# Patient Record
Sex: Male | Born: 1990 | Race: White | Hispanic: No | Marital: Single | Smoking: Never smoker
Health system: Southern US, Community
[De-identification: ages and names within clinical notes are randomized; demographics above are authoritative.]

## PROBLEM LIST (undated history)

## (undated) HISTORY — PX: NO PAST SURGERIES: SHX2092

---
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
## 2011-03-19 ENCOUNTER — Emergency Department: Payer: Self-pay | Admitting: Emergency Medicine

## 2011-08-21 ENCOUNTER — Emergency Department: Payer: Self-pay | Admitting: Emergency Medicine

## 2013-07-09 ENCOUNTER — Emergency Department: Payer: Self-pay | Admitting: Emergency Medicine

## 2014-10-16 IMAGING — CR RIGHT ANKLE - COMPLETE 3+ VIEW
1 series · 3 of 3 positions shown · non-contrast
Comparison: None.

CLINICAL DATA: Right ankle pain

EXAM:
RIGHT ANKLE - COMPLETE 3+ VIEW

[Series 1: ap · 0.17mm/px · 3 of 3 slices shown]
[im 1/3]
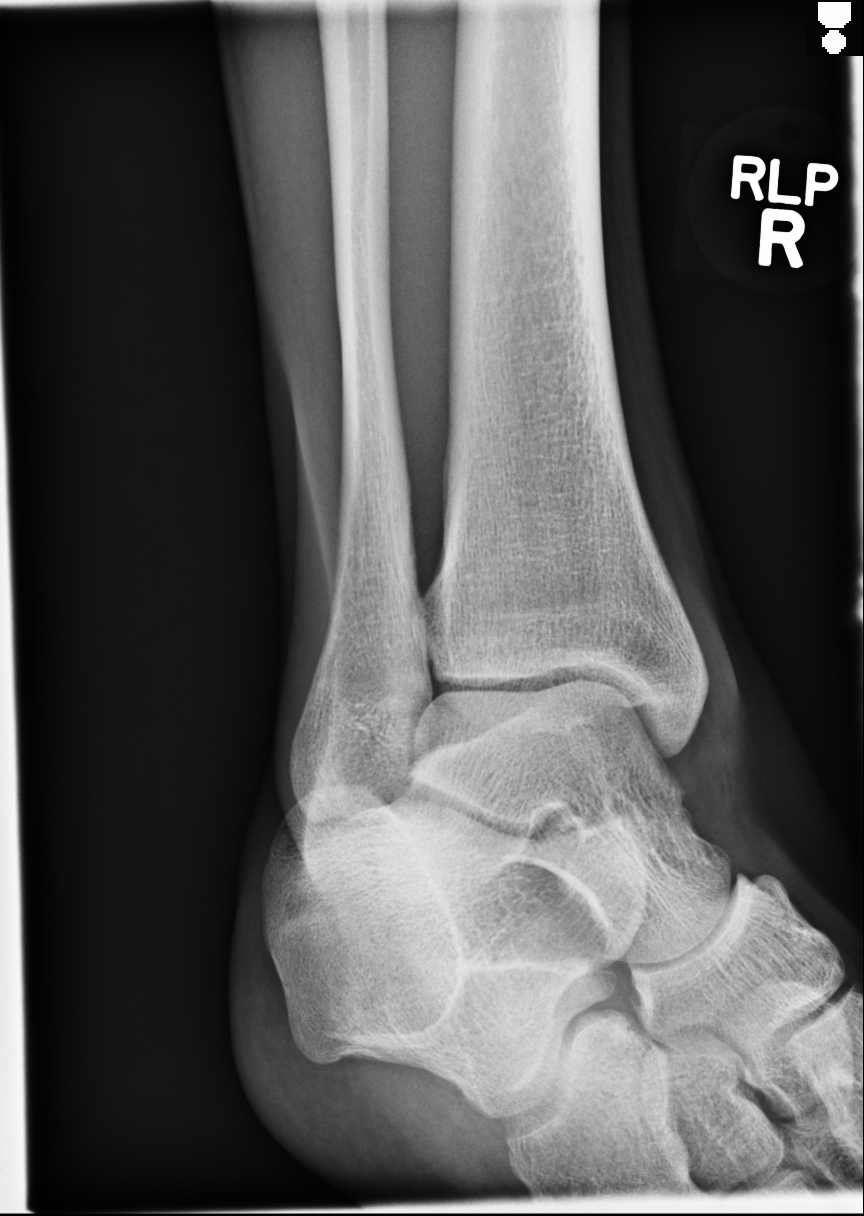
[im 2/3]
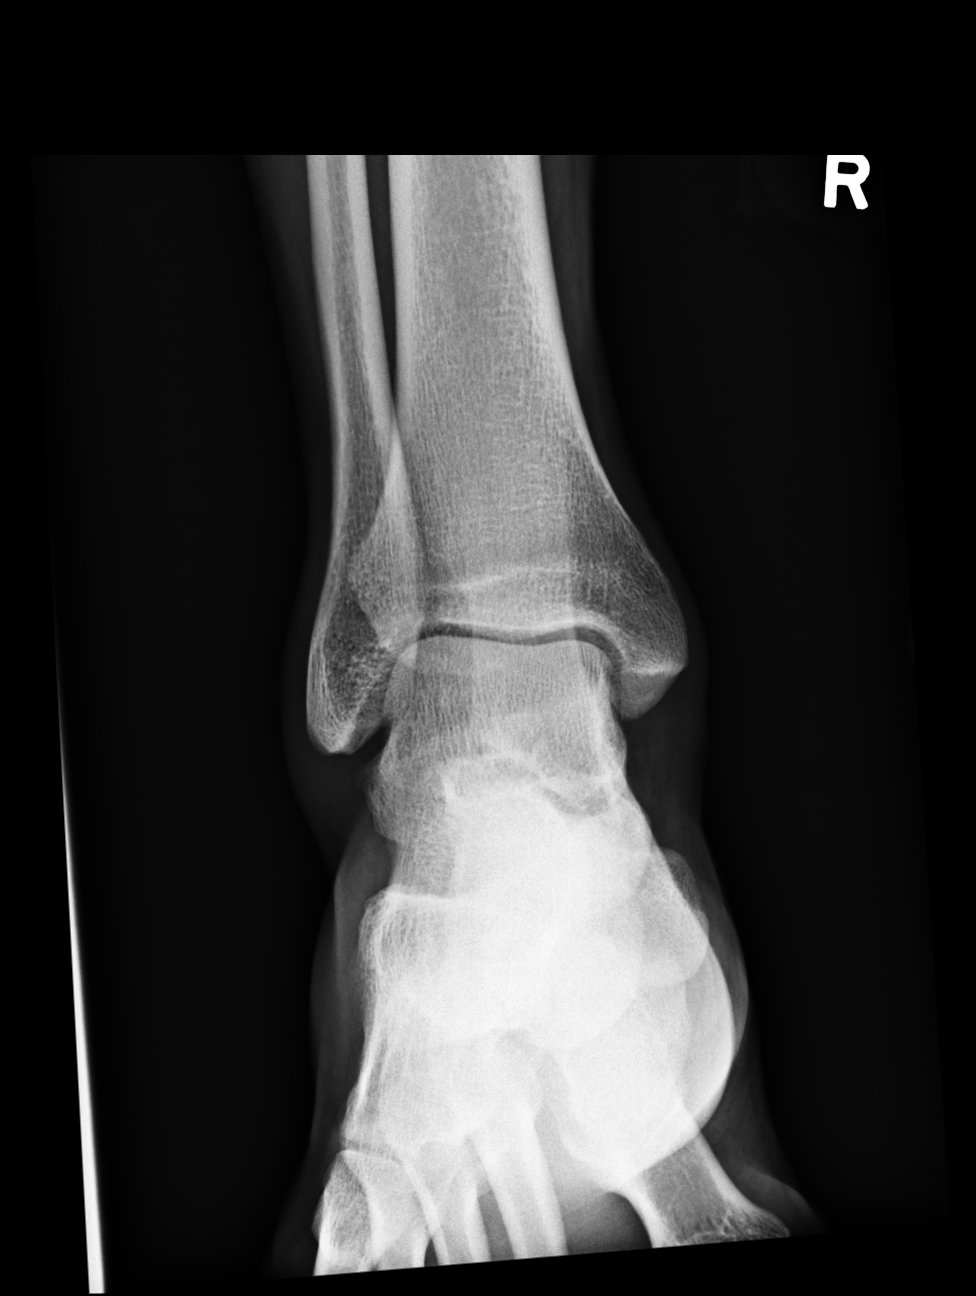
[im 3/3]
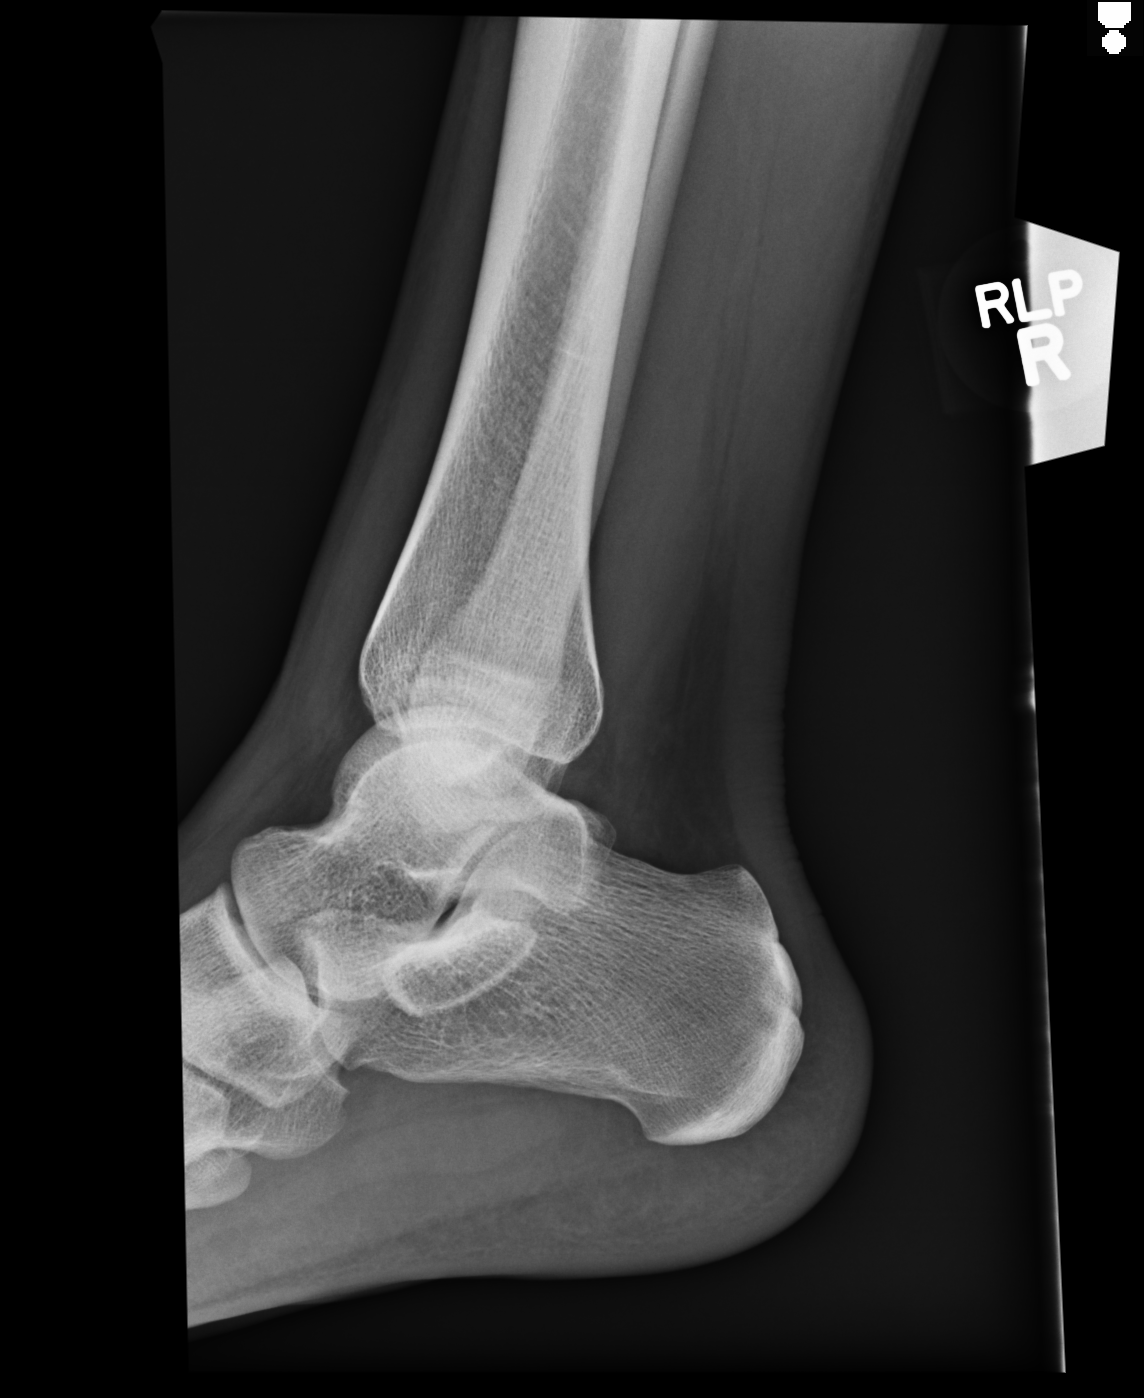

[3 of 3 positions shown; findings below may reference images not displayed]

FINDINGS: There is no evidence of fracture, dislocation, or joint effusion.
There is no evidence of arthropathy or other focal bone abnormality.
Soft tissues are unremarkable.
IMPRESSION: No acute abnormality noted.

## 2015-03-27 ENCOUNTER — Emergency Department
Admission: EM | Admit: 2015-03-27 | Discharge: 2015-03-27 | Disposition: A | Discharge status: Home / Self Care | Payer: Self-pay | Attending: Student | Admitting: Student

## 2015-03-27 ENCOUNTER — Encounter: Payer: Self-pay | Admitting: *Deleted

## 2015-03-27 DIAGNOSIS — K011 Impacted teeth: Secondary | ICD-10-CM | POA: Insufficient documentation

## 2015-03-27 DIAGNOSIS — Z880 Allergy status to penicillin: Secondary | ICD-10-CM | POA: Insufficient documentation

## 2015-03-27 DIAGNOSIS — K0889 Other specified disorders of teeth and supporting structures: Secondary | ICD-10-CM | POA: Insufficient documentation

## 2015-03-27 MED ORDER — SULFAMETHOXAZOLE-TRIMETHOPRIM 800-160 MG PO TABS: 1.0000 | ORAL_TABLET | Freq: Two times a day (BID) | ORAL | Status: DC

## 2015-03-27 NOTE — ED Notes (Signed)
States dental pain left side, states he was supposed to get his wisdom teeth out a while ago and hasnt

## 2015-03-27 NOTE — ED Provider Notes (Signed)
[facility] Emergency Department Provider Note  ____________________________________________  Time seen: Approximately 4:22 PM  I have reviewed the triage vital signs and the nursing notes.   HISTORY  Chief Complaint Dental Pain    HPI Paul Love is a 25 y.o. male presents with pains to his left lower gumline. Patient states that he got was some teeth that are impacted and concerned about infection. Denies any pain just concerned about infection.Pain is nonradiating has taken over-the-counter Tylenol and ibuprofen with good relief. Has not contacted tenderness secondary to come. Rates pain as a 1/10.   History reviewed. No pertinent past medical history.  There are no active problems to display for this patient.   No past surgical history on file.  Current Outpatient Rx  Name  Route  Sig  Dispense  Refill  . sulfamethoxazole-trimethoprim (BACTRIM DS,SEPTRA DS) 800-160 MG tablet   Oral   Take 1 tablet by mouth 2 (two) times daily.   20 tablet   0     Allergies Penicillins  History reviewed. No pertinent family history.  Social History Social History  Substance Use Topics  . Smoking status: None  . Smokeless tobacco: None  . Alcohol Use: None    Review of Systems Constitutional: No fever/chills Eyes: No visual changes. ENT: No sore throat. Positive for dental pain and impacted wisdom teeth Cardiovascular: Denies chest pain. Respiratory: Denies shortness of breath. Gastrointestinal: No abdominal pain.  No nausea, no vomiting.  No diarrhea.  No constipation. Genitourinary: Negative for dysuria. Musculoskeletal: Negative for back pain. Skin: Negative for rash. Neurological: Negative for headaches, focal weakness or numbness.  10-point ROS otherwise negative.  ____________________________________________   PHYSICAL EXAM:  VITAL SIGNS: ED Triage Vitals  Enc Vitals Group     BP 03/27/15 1610 135/85 mmHg     Pulse Rate  03/27/15 1610 68     Resp 03/27/15 1610 18     Temp 03/27/15 1610 97.6 F (36.4 C)     Temp Source 03/27/15 1610 Oral     SpO2 03/27/15 1610 100 %     Weight 03/27/15 1610 160 lb (72.576 kg)     Height 03/27/15 1610  (1.88 m)     Head Cir --      Peak Flow --      Pain Score --      Pain Loc --      Pain Edu? --      Excl. in [place]? --     Constitutional: Alert and oriented. Well appearing and in no acute distress. Mouth/Throat: Mucous membranes are moist.  Left lower wisdom teeth impacted with erythema noted to the gumline. Neck: No stridor.   Cardiovascular: Normal rate, regular rhythm. Grossly normal heart sounds.  Good peripheral circulation. Respiratory: Normal respiratory effort.  No retractions. Lungs CTAB. Neurologic:  Normal speech and language. No gross focal neurologic deficits are appreciated. No gait instability. Skin:  Skin is warm, dry and intact. No rash noted. Psychiatric: Mood and affect are normal. Speech and behavior are normal.  ____________________________________________   LABS (all labs ordered are listed, but only abnormal results are displayed)  Labs Reviewed - No data to display ____________________________________________   PROCEDURES  Procedure(s) performed: None  Critical Care performed: No  ____________________________________________   INITIAL IMPRESSION / ASSESSMENT AND PLAN / ED COURSE  Pertinent labs & imaging results that were available during my care of the patient were reviewed by me and considered in my medical decision making (see  chart for details).  Impacted wisdom teeth with erythema to the gumline. Rx given for Bactrim DS twice a day #20. No pain medications necessary at this time. Will continue use over-the-counter Tylenol and ibuprofen as needed. List of local dentists given to the patient. ____________________________________________   FINAL CLINICAL IMPRESSION(S) / ED DIAGNOSES  Final diagnoses:  Pain, dental       Evangeline Dakin, PA-C 03/27/15 1630  Gayla Doss, MD 03/28/15 [phone]

## 2015-03-27 NOTE — ED Notes (Signed)
States he developed pain to left upper gumline yesterday  With tooth pain

## 2015-03-27 NOTE — Discharge Instructions (Signed)
OPTIONS FOR DENTAL FOLLOW UP CARE ° °[place] Department of Health and Human Services - Local Safety Net Dental Clinics °http://www.ncdhhs.gov/dph/oralhealth/services/safetynetclinics.htm °  °[facility] (336-562-3123) ° °[facility] [place] (919-933-9087) ° °[facility] ([phone] ext [phone]) ° °[facility] ([phone]) ° °[facility] ([phone]) °This clinic caters to the indigent population and is on a lottery system. °Location: °[facility], [address], [place] °Clinic Hours: °Wednesdays from 6pm - 9pm, patients seen by a lottery system. °For dates, call or go to www.med.[facility].edu/[facility]/patients/Dental-[facility] °Services: °Cleanings, fillings and simple extractions. °Payment Options: °DENTAL WORK IS FREE OF CHARGE. Bring proof of income or support. °Best way to get seen: °Arrive at 5:15 pm - this is a lottery, NOT first come/first serve, so arriving earlier will not increase your chances of being seen. °  °  °[facility] °[phone] °Select option 1 for emergencies °  °Location: °[facility], [address], [place] °Clinic Hours: °No walk-ins accepted - call the day before to schedule an appointment. °Check in times are 9:30 am and 1:30 pm. °Services: °Simple extractions, temporary fillings, pulpectomy/pulp debridement, uncomplicated abscess drainage. °Payment Options: °PAYMENT IS DUE AT THE TIME OF SERVICE.  Fee is usually $100-200, additional surgical procedures (e.g. abscess drainage) may be extra. °Cash, checks, Visa/MasterCard accepted.  Can file Medicaid if patient is covered for dental - patient should call case worker to check. °No discount for [facility] patients. °Best way to get seen: °MUST call the day before and get onto the schedule. Can usually be seen the next 1-2 days. No walk-ins accepted. °  °  °[facility] °[phone] °   °Location: °[facility], [address], [place] °Clinic Hours: °M, W, Th, F 8am or 1:30pm, Tues 9a or 1:30 - first come/first served. °Services: °Simple extractions, temporary fillings, uncomplicated abscess drainage.  You do not need to be an [place] resident. °Payment Options: °PAYMENT IS DUE AT THE TIME OF SERVICE. °Dental insurance, otherwise sliding scale - bring proof of income or support. °Depending on income and treatment needed, cost is usually $50-200. °Best way to get seen: °Arrive early as it is first come/first served. °  °  °[facility] °[phone] °  °Location: °[address] °Clinic Hours: °Mon-Thu 8a-5p °Services: °Most basic dental services including extractions and fillings. °Payment Options: °PAYMENT IS DUE AT THE TIME OF SERVICE. °Sliding scale, up to 50% off - bring proof if income or support. °Medicaid with dental option accepted. °Best way to get seen: °Call to schedule an appointment, can usually be seen within 2 weeks OR they will try to see walk-ins - show up at 8a or 2p (you may have to wait). °  °  °[facility] °919-245-2435 °[place] RESIDENTS ONLY °  °Location: °[facility], [address], [place], [place] [address] °Clinic Hours: By appointment only. °Monday - Thursday 8am-5pm, Friday 8am-12pm °Services: Cleanings, fillings, extractions. °Payment Options: °PAYMENT IS DUE AT THE TIME OF SERVICE. °Cash, Visa or MasterCard. Sliding scale - $30 minimum per service. °Best way to get seen: °Come in to office, complete packet and make an appointment - need proof of income °or support monies for each household member and proof of [place] residence. °Usually takes about a month to get in. °  °  °[facility] °[phone] °  °Location: °[address]   [place] °Clinic Hours: Walk-in Urgent Care Dental Services are offered Monday-Friday  mornings only. °The numbers of emergencies accepted daily is limited to the number of °providers available. °Maximum 15 - Mondays, Wednesdays & Thursdays °Maximum 10 - Tuesdays & Fridays °Services: °You do not need to be a [place] resident to be seen for a dental emergency. °Emergencies are defined as pain, swelling, abnormal bleeding, or dental trauma. Walkins will receive x-rays if needed. °NOTE: Dental cleaning is not an emergency. °Payment Options: °PAYMENT IS DUE AT THE TIME OF SERVICE. °Minimum co-pay is $40.00 for uninsured patients. °Minimum co-pay is $3.00 for Medicaid with dental coverage. °Dental Insurance is accepted and must be presented at time of visit. °Medicare does not cover dental. °Forms of payment: Cash, credit card, checks. °Best way to get seen: °If not previously registered with the clinic, walk-in dental registration begins at 7:15 am and is on a first come/first serve basis. °If previously registered with the clinic, call to make an appointment. °  °  °[facility] °[phone] °[place] RESIDENTS ONLY °  °Location: °[address], [place], [place] °Clinic Hours: °Mon-Thu 10a-2p °Services: Extractions only! °Payment Options: °FREE (donations accepted) - bring proof of income or support °Best way to get seen: °Call and schedule an appointment OR come at 8am on the 1st Monday of every month (except for holidays) when it is first come/first served. °  °  °[facility] °[phone] °  °Location: °[address], [place] °Clinic Hours: °Friday mornings °Services, Payment Options, Best way to get seen: °Call for info °

## 2018-11-24 ENCOUNTER — Ambulatory Visit
Admission: EM | Admit: 2018-11-24 | Discharge: 2018-11-24 | Disposition: A | Discharge status: Home / Self Care | Payer: Managed Care, Other (non HMO) | Attending: Family Medicine | Admitting: Family Medicine

## 2018-11-24 ENCOUNTER — Other Ambulatory Visit: Payer: Self-pay

## 2018-11-24 ENCOUNTER — Encounter: Payer: Self-pay | Admitting: Emergency Medicine

## 2018-11-24 DIAGNOSIS — Z23 Encounter for immunization: Secondary | ICD-10-CM | POA: Diagnosis not present

## 2018-11-24 DIAGNOSIS — S41112A Laceration without foreign body of left upper arm, initial encounter: Secondary | ICD-10-CM

## 2018-11-24 DIAGNOSIS — W268XXA Contact with other sharp object(s), not elsewhere classified, initial encounter: Secondary | ICD-10-CM | POA: Diagnosis not present

## 2018-11-24 MED ORDER — MUPIROCIN 2 % EX OINT: TOPICAL_OINTMENT | CUTANEOUS | 0 refills | Status: AC

## 2018-11-24 MED ORDER — TETANUS-DIPHTH-ACELL PERTUSSIS 5-2.5-18.5 LF-MCG/0.5 IM SUSP
0.5000 mL | Freq: Once | INTRAMUSCULAR | Status: AC
  Administered 2018-11-24: 0.5 mL via INTRAMUSCULAR

## 2018-11-24 MED ORDER — DOXYCYCLINE HYCLATE 100 MG PO CAPS: 100.0000 mg | ORAL_CAPSULE | Freq: Two times a day (BID) | ORAL | 0 refills | Status: AC

## 2018-11-24 NOTE — ED Triage Notes (Signed)
Pt has a laceration on his left forearm. He states he cut it on a piece of sheet metal. Occurred about 9 am this morning.  11/03/2011 but greater than 5 years, will update today.

## 2018-11-24 NOTE — Discharge Instructions (Addendum)
Take medication as prescribed. Rest. Drink plenty of fluids. Keep clean.  ° °Follow up with your primary care physician this week as needed. Return to Urgent care for new or worsening concerns.  ° °

## 2018-11-24 NOTE — ED Provider Notes (Signed)
[facility] ____________________________________________  Time seen: Approximately 6:19 PM  I have reviewed the triage vital signs and the nursing notes.   HISTORY  Chief Complaint Laceration (left forearm)   HPI Paul Love is a 28 y.o. male presenting for evaluation of left proximal forearm laceration that occurred at 9 AM this morning.  States he was helping his dad with a project and when he lifted his arm up he had a piece of sheet metal hanging down from a shed.  Reports he did clean it immediately and then applied dressing.  Reports coming in for further evaluation now.  States minimal tenderness to the laceration site.  Unsure of last tetanus immunization.  Denies paresthesias, decreased range of motion or pain radiation.  Reports otherwise doing well.  No recent sickness.   History reviewed. No pertinent past medical history.  There are no active problems to display for this patient.   Past Surgical History:  Procedure Laterality Date  . NO PAST SURGERIES       No current facility-administered medications for this encounter.   Current Outpatient Medications:  .  doxycycline (VIBRAMYCIN) 100 MG capsule, Take 1 capsule (100 mg total) by mouth 2 (two) times daily for 5 days., Disp: 10 capsule, Rfl: 0 .  mupirocin ointment (BACTROBAN) 2 %, Apply two times a day for 7 days., Disp: 22 g, Rfl: 0  Allergies Penicillins  Family History  Problem Relation Age of Onset  . Healthy Mother   . Healthy Father     Social History Social History   Tobacco Use  . Smoking status: Never Smoker  . Smokeless tobacco: Never Used  Substance Use Topics  . Alcohol use: Yes  . Drug use: Not Currently    Review of Systems Constitutional: No fever Cardiovascular: Denies chest pain. Respiratory: Denies shortness of breath. Gastrointestinal: No abdominal pain.   Musculoskeletal: Positive left arm pain. Skin: Positive laceration. Neurological: Negative for focal  weakness or numbness.    ____________________________________________   PHYSICAL EXAM:  VITAL SIGNS: ED Triage Vitals  Enc Vitals Group     BP 11/24/18 1723 (!) 141/79     Pulse Rate 11/24/18 1723 72     Resp 11/24/18 1723 18     Temp 11/24/18 1723 98.1 F (36.7 C)     Temp Source 11/24/18 1723 Oral     SpO2 11/24/18 1723 100 %     Weight 11/24/18 1720 170 lb (77.1 kg)     Height 11/24/18 1720 6' (1.829 m)     Head Circumference --      Peak Flow --      Pain Score 11/24/18 1720 0     Pain Loc --      Pain Edu? --      Excl. in [place]? --     Constitutional: Alert and oriented. Well appearing and in no acute distress. Eyes: Conjunctivae are normal.  ENT      Head: Normocephalic and atraumatic. Cardiovascular: Normal rate, regular rhythm. Grossly normal heart sounds.  Good peripheral circulation. Respiratory: Normal respiratory effort without tachypnea nor retractions. Breath sounds are clear and equal bilaterally. No wheezes, rales, rhonchi. Musculoskeletal: Steady gait. Neurologic:  Normal speech and language. Speech is normal. No gait instability.  Skin:  Skin is warm, dry.  Except: Left outer proximal forearm 3.5 cm L-shaped laceration present, no active bleeding, no foreign body noted, no bone or tendon visualized, no surrounding erythema, minimal tenderness, no bony tenderness, full range of motion present,  no paresthesias. Psychiatric: Mood and affect are normal. Speech and behavior are normal. Patient exhibits appropriate insight and judgment   ___________________________________________   LABS (all labs ordered are listed, but only abnormal results are displayed)  Labs Reviewed - No data to display   PROCEDURES Procedures   Procedure(s) performed:  Procedure explained and verbal consent obtained. Consent: Verbal consent obtained. Written consent not obtained. Risks and benefits: risks, benefits and alternatives were discussed Patient identity confirmed:  verbally with patient and hospital-assigned identification number  Consent given by: patient   Laceration Repair Location: Left forearm Length: 3.5 cm Foreign bodies: no foreign bodies Tendon involvement: none Nerve involvement: none Preparation: Patient was prepped and draped in the usual sterile fashion. Anesthesia with 1% Lidocaine with epi 5 mls Cleaned with Betadine Irrigation solution: Sterile water Irrigation method: jet lavage Amount of cleaning: copious Repaired with 5-0 nylon Number of sutures: 5 Technique: simple interrupted  Approximation: loose Patient tolerate well. Wound well approximated post repair.  Antibiotic ointment and dressing applied.  Wound care instructions provided.  Observe for any signs of infection or other problems.      INITIAL IMPRESSION / ASSESSMENT AND PLAN / ED COURSE  Pertinent labs & imaging results that were available during my care of the patient were reviewed by me and considered in my medical decision making (see chart for details).  Well-appearing patient.  No acute distress.  Left arm laceration as above.  Tetanus immunization updated.  Cleaned and repaired as above.  Patient tolerated well.  As laceration was obtained from outdoor object and laceration was open since this morning will empirically treat with 5-day course of doxycycline and topical Bactroban.  Supportive care.  Suture removal in 7 to 10 days.Discussed indication, risks and benefits of medications with patient.  Discussed follow up with Primary care physician this week. Discussed follow up and return parameters including no resolution or any worsening concerns. Patient verbalized understanding and agreed to plan.   ____________________________________________   FINAL CLINICAL IMPRESSION(S) / ED DIAGNOSES  Final diagnoses:  Arm laceration, left, initial encounter     ED Discharge Orders         Ordered    doxycycline (VIBRAMYCIN) 100 MG capsule  2 times daily      11/24/18 1758    mupirocin ointment (BACTROBAN) 2 %     11/24/18 1758           Note: This dictation was prepared with Dragon dictation along with smaller phrase technology. Any transcriptional errors that result from this process are unintentional.         Renford Dills, NP 11/24/18 1828

## 2018-11-26 ENCOUNTER — Telehealth: Payer: Self-pay | Admitting: Emergency Medicine

## 2018-11-26 NOTE — Telephone Encounter (Signed)
Spoke to Paul Land, NP who saw patient on 11/24/18.  Patient was notified that it was okay to stop the Doxycycline per Paul Land, NP and to keep the area clean and dry.  Patient advised to follow-up here or with PCP if he noticies any signs of infection.  Patient verbalized understanding.

## 2018-11-26 NOTE — Telephone Encounter (Signed)
Patient called and left message stating that he was having some blurry vision after he started taking his Doxycycline.  Patient wanted to know if he should continue with the Doxycycline or get a different antibiotic.  Please advise.  Patient was seen on 11/24/18 for arm laceration.

## 2023-03-09 ENCOUNTER — Ambulatory Visit
Admission: RE | Admit: 2023-03-09 | Discharge: 2023-03-09 | Disposition: A | Discharge status: Home / Self Care | Payer: No Typology Code available for payment source | Source: Ambulatory Visit | Attending: Family Medicine | Admitting: Family Medicine

## 2023-03-09 VITALS — BP 117/81 | HR 82 | Temp 98.9°F | Resp 16 | Ht 73.0 in | Wt 170.0 lb

## 2023-03-09 DIAGNOSIS — R300 Dysuria: Secondary | ICD-10-CM | POA: Diagnosis present

## 2023-03-09 DIAGNOSIS — R369 Urethral discharge, unspecified: Secondary | ICD-10-CM | POA: Diagnosis present

## 2023-03-09 LAB — URINALYSIS, W/ REFLEX TO CULTURE (INFECTION SUSPECTED)
Bilirubin Urine: NEGATIVE
Glucose, UA: NEGATIVE mg/dL
Hgb urine dipstick: NEGATIVE
Ketones, ur: NEGATIVE mg/dL
Leukocytes,Ua: NEGATIVE
Nitrite: NEGATIVE
Protein, ur: NEGATIVE mg/dL
RBC / HPF: NONE SEEN RBC/hpf (ref 0–5)
Specific Gravity, Urine: 1.02 (ref 1.005–1.030)
pH: 7 (ref 5.0–8.0)

## 2023-03-09 NOTE — Discharge Instructions (Addendum)
 Your STD test results will be available in the next 72 hours. If positive, someone will contact you.  You should see your results in your MyChart account.   If your results are negative, speak with your primary care doctor about seeing a urologist.

## 2023-03-09 NOTE — ED Triage Notes (Signed)
 Pt c/o urinary freq & penile discharge x1 day. Concerned about STD. Denies any hematuria.

## 2023-03-09 NOTE — ED Provider Notes (Signed)
 [facility]    CSN: [id number] Arrival date & time: 03/09/23  1720      History   Chief Complaint Chief Complaint  Patient presents with   Urinary Frequency    Appt   SEXUALLY TRANSMITTED DISEASE   Penile Discharge    HPI  HPI  Paul Love is a 33 y.o. male dysuria, urinary frequency and penile discharge that started yesterday.  Has yellow discharge.  He is concerned that maybe he has a urinary tract infection. Denies known STI exposure.   Reports no symptoms in partner/wife.  Denies any outside partners.  Paul Love does not use condoms regularly.        History reviewed. No pertinent past medical history.  There are no active problems to display for this patient.   Past Surgical History:  Procedure Laterality Date   NO PAST SURGERIES         Home Medications    Prior to Admission medications   Medication Sig Start Date End Date Taking? Authorizing Provider  mupirocin  ointment (BACTROBAN ) 2 % Apply two times a day for 7 days. 11/24/18   Cleotilde Jacobsen, NP    Family History Family History  Problem Relation Age of Onset   Healthy Mother    Healthy Father     Social History Social History   Tobacco Use   Smoking status: Never   Smokeless tobacco: Never  Vaping Use   Vaping status: Every Day  Substance Use Topics   Alcohol use: Yes   Drug use: Not Currently     Allergies   Penicillins   Review of Systems Review of Systems: negative unless otherwise stated in HPI.      Physical Exam Triage Vital Signs ED Triage Vitals  Encounter Vitals Group     BP 03/09/23 1743 117/81     Systolic BP Percentile --      Diastolic BP Percentile --      Pulse Rate 03/09/23 1743 82     Resp 03/09/23 1743 16     Temp 03/09/23 1743 98.9 F (37.2 C)     Temp Source 03/09/23 1743 Oral     SpO2 03/09/23 1743 96 %     Weight 03/09/23 1743 170 lb (77.1 kg)     Height 03/09/23 1743 6' 1 (1.854 m)     Head Circumference --      Peak Flow --       Pain Score 03/09/23 1746 0     Pain Loc --      Pain Education --      Exclude from Growth Chart --    No data found.  Updated Vital Signs BP 117/81 (BP Location: Right Arm)   Pulse 82   Temp 98.9 F (37.2 C) (Oral)   Resp 16   Ht 6' 1 (1.854 m)   Wt 77.1 kg   SpO2 96%   BMI 22.43 kg/m   Visual Acuity Right Eye Distance:   Left Eye Distance:   Bilateral Distance:    Right Eye Near:   Left Eye Near:    Bilateral Near:     Physical Exam GEN: well appearing male in no acute distress  CVS: well perfused  RESP: speaking in full sentences without pause, no respiratory distress  GU: deferred, patient performed self swab    UC Treatments / Results  Labs (all labs ordered are listed, but only abnormal results are displayed) Labs Reviewed  URINALYSIS, W/ REFLEX TO CULTURE (INFECTION SUSPECTED) -  Abnormal; Notable for the following components:      Result Value   Bacteria, UA FEW (*)    All other components within normal limits  CYTOLOGY, (ORAL, ANAL, URETHRAL) ANCILLARY ONLY    EKG   Radiology No results found.  Procedures Procedures (including critical care time)  Medications Ordered in UC Medications - No data to display  Initial Impression / Assessment and Plan / UC Course  I have reviewed the triage vital signs and the nursing notes.  Pertinent labs & imaging results that were available during my care of the patient were reviewed by me and considered in my medical decision making (see chart for details).     Patient is a 33 y.o.. male who presents for dysuria with penile discharge.  Overall patient is well-appearing and afebrile.  Vital signs stable.  Declines prophylactic treatment.  Obtained gonorrhea, chlamydia and trichomonas swab.  Recommended if his STD testing is negative that he follow-up with a primary care provider to discuss seeing a urologist as he may have Ureaplasma  or mycoplasma genitalium.   Discussed MDM, treatment plan and plan for  follow-up with patient who agrees with plan.     Final Clinical Impressions(s) / UC Diagnoses   Final diagnoses:  Dysuria  Penile discharge     Discharge Instructions       Your STD test results will be available in the next 72 hours. If positive, someone will contact you.  You should see your results in your MyChart account.   If your results are negative, speak with your primary care doctor about seeing a urologist.      ED Prescriptions   None    PDMP not reviewed this encounter.   Rembert Browe, DO 03/09/23 1839

## 2023-03-10 LAB — CYTOLOGY, (ORAL, ANAL, URETHRAL) ANCILLARY ONLY
Chlamydia: NEGATIVE
Comment: NEGATIVE
Comment: NEGATIVE
Comment: NORMAL
Neisseria Gonorrhea: NEGATIVE
Trichomonas: NEGATIVE
